# Patient Record
Sex: Female | Born: 1950 | Race: White | Hispanic: No | Marital: Married | Smoking: Never smoker
Health system: Southern US, Community
[De-identification: ages and names within clinical notes are randomized; demographics above are authoritative.]

## PROBLEM LIST (undated history)

## (undated) DIAGNOSIS — E785 Hyperlipidemia, unspecified: Secondary | ICD-10-CM

## (undated) DIAGNOSIS — J309 Allergic rhinitis, unspecified: Secondary | ICD-10-CM

## (undated) DIAGNOSIS — R030 Elevated blood-pressure reading, without diagnosis of hypertension: Secondary | ICD-10-CM

## (undated) DIAGNOSIS — S2239XA Fracture of one rib, unspecified side, initial encounter for closed fracture: Secondary | ICD-10-CM

## (undated) HISTORY — DX: Allergic rhinitis, unspecified: J30.9

## (undated) HISTORY — PX: OTHER SURGICAL HISTORY: SHX169

## (undated) HISTORY — DX: Elevated blood-pressure reading, without diagnosis of hypertension: R03.0

## (undated) HISTORY — DX: Fracture of one rib, unspecified side, initial encounter for closed fracture: S22.39XA

## (undated) HISTORY — PX: ANKLE SURGERY: SHX546

## (undated) HISTORY — DX: Hyperlipidemia, unspecified: E78.5

---

## 1999-05-21 ENCOUNTER — Other Ambulatory Visit: Admission: RE | Admit: 1999-05-21 | Discharge: 1999-05-21 | Payer: Self-pay | Admitting: Obstetrics and Gynecology

## 2000-06-17 ENCOUNTER — Other Ambulatory Visit: Admission: RE | Admit: 2000-06-17 | Discharge: 2000-06-17 | Payer: Self-pay | Admitting: Obstetrics and Gynecology

## 2001-09-13 ENCOUNTER — Other Ambulatory Visit: Admission: RE | Admit: 2001-09-13 | Discharge: 2001-09-13 | Payer: Self-pay | Admitting: Gynecology

## 2003-01-10 ENCOUNTER — Other Ambulatory Visit: Admission: RE | Admit: 2003-01-10 | Discharge: 2003-01-10 | Payer: Self-pay | Admitting: Gynecology

## 2004-01-15 ENCOUNTER — Other Ambulatory Visit: Admission: RE | Admit: 2004-01-15 | Discharge: 2004-01-15 | Payer: Self-pay | Admitting: Gynecology

## 2005-02-17 ENCOUNTER — Other Ambulatory Visit: Admission: RE | Admit: 2005-02-17 | Discharge: 2005-02-17 | Payer: Self-pay | Admitting: Gynecology

## 2005-10-22 ENCOUNTER — Ambulatory Visit: Payer: Self-pay | Admitting: Family Medicine

## 2005-12-22 ENCOUNTER — Ambulatory Visit: Payer: Self-pay | Admitting: Family Medicine

## 2005-12-22 LAB — CONVERTED CEMR LAB
Chol/HDL Ratio, serum: 2.8
Cholesterol: 246 mg/dL (ref 0–200)
HDL: 88.2 mg/dL (ref >39.0)
LDL DIRECT: 134.6 mg/dL
VLDL: 20 mg/dL (ref 0–40)

## 2006-03-16 ENCOUNTER — Other Ambulatory Visit: Admission: RE | Admit: 2006-03-16 | Discharge: 2006-03-16 | Payer: Self-pay | Admitting: Gynecology

## 2006-03-19 ENCOUNTER — Encounter: Admission: RE | Admit: 2006-03-19 | Discharge: 2006-03-19 | Payer: Self-pay | Admitting: Gynecology

## 2007-02-17 ENCOUNTER — Encounter: Admission: RE | Admit: 2007-02-17 | Discharge: 2007-02-17 | Payer: Self-pay | Admitting: Gynecology

## 2007-02-23 ENCOUNTER — Ambulatory Visit: Payer: Self-pay | Admitting: Family Medicine

## 2007-02-23 LAB — CONVERTED CEMR LAB
ALT: 29 units/L (ref 0–35)
AST: 36 units/L (ref 0–37)
Albumin: 4.6 g/dL (ref 3.5–5.2)
Alkaline Phosphatase: 93 units/L (ref 39–117)
BUN: 15 mg/dL (ref 6–23)
Basophils Absolute: 0.1 10*3/uL (ref 0.0–0.1)
Basophils Relative: 0.9 % (ref 0.0–1.0)
Bilirubin, Direct: 0.2 mg/dL (ref 0.0–0.3)
Blood in Urine, dipstick: NEGATIVE
CO2: 33 meq/L — ABNORMAL HIGH (ref 19–32)
Calcium: 10.1 mg/dL (ref 8.4–10.5)
Chloride: 96 meq/L (ref 96–112)
Cholesterol: 272 mg/dL (ref 0–200)
Creatinine, Ser: 0.9 mg/dL (ref 0.4–1.2)
Direct LDL: 174.8 mg/dL
Eosinophils Absolute: 0.3 10*3/uL (ref 0.0–0.6)
Eosinophils Relative: 4.4 % (ref 0.0–5.0)
GFR calc Af Amer: 84 mL/min
GFR calc non Af Amer: 69 mL/min
Glucose, Bld: 91 mg/dL (ref 70–99)
Glucose, Urine, Semiquant: NEGATIVE
HCT: 42.9 % (ref 36.0–46.0)
HDL: 81.1 mg/dL (ref >39.0)
Hemoglobin: 15.1 g/dL — ABNORMAL HIGH (ref 12.0–15.0)
Ketones, urine, test strip: NEGATIVE
Lymphocytes Relative: 36.2 % (ref 12.0–46.0)
MCHC: 35.1 g/dL (ref 30.0–36.0)
MCV: 96.2 fL (ref 78.0–100.0)
Monocytes Absolute: 0.6 10*3/uL (ref 0.2–0.7)
Monocytes Relative: 8.6 % (ref 3.0–11.0)
Neutro Abs: 3.7 10*3/uL (ref 1.4–7.7)
Neutrophils Relative %: 49.9 % (ref 43.0–77.0)
Nitrite: NEGATIVE
Platelets: 325 10*3/uL (ref 150–400)
Potassium: 3.5 meq/L (ref 3.5–5.1)
Protein, U semiquant: NEGATIVE
RBC: 4.46 M/uL (ref 3.87–5.11)
RDW: 13.2 % (ref 11.5–14.6)
Sodium: 140 meq/L (ref 135–145)
Specific Gravity, Urine: 1.025
TSH: 1.91 microintl units/mL (ref 0.35–5.50)
Total Bilirubin: 0.8 mg/dL (ref 0.3–1.2)
Total CHOL/HDL Ratio: 3.4
Total Protein: 7.8 g/dL (ref 6.0–8.3)
Triglycerides: 119 mg/dL (ref 0–149)
Urobilinogen, UA: 0.2
VLDL: 24 mg/dL (ref 0–40)
WBC: 7.3 10*3/uL (ref 4.5–10.5)
pH: 5.5

## 2007-03-02 ENCOUNTER — Ambulatory Visit: Payer: Self-pay | Admitting: Family Medicine

## 2007-03-02 DIAGNOSIS — R030 Elevated blood-pressure reading, without diagnosis of hypertension: Secondary | ICD-10-CM

## 2007-03-02 DIAGNOSIS — E785 Hyperlipidemia, unspecified: Secondary | ICD-10-CM | POA: Insufficient documentation

## 2007-03-02 HISTORY — DX: Elevated blood-pressure reading, without diagnosis of hypertension: R03.0

## 2007-03-02 HISTORY — DX: Hyperlipidemia, unspecified: E78.5

## 2007-03-15 ENCOUNTER — Telehealth: Payer: Self-pay | Admitting: Family Medicine

## 2007-05-06 ENCOUNTER — Ambulatory Visit: Payer: Self-pay | Admitting: Family Medicine

## 2007-05-06 LAB — CONVERTED CEMR LAB
Cholesterol: 221 mg/dL (ref 0–200)
Total CHOL/HDL Ratio: 3.5
Triglycerides: 136 mg/dL (ref 0–149)

## 2007-07-05 DIAGNOSIS — S2239XA Fracture of one rib, unspecified side, initial encounter for closed fracture: Secondary | ICD-10-CM

## 2007-07-05 HISTORY — DX: Fracture of one rib, unspecified side, initial encounter for closed fracture: S22.39XA

## 2007-07-15 ENCOUNTER — Telehealth (INDEPENDENT_AMBULATORY_CARE_PROVIDER_SITE_OTHER): Payer: Self-pay | Admitting: *Deleted

## 2007-07-18 ENCOUNTER — Telehealth: Payer: Self-pay | Admitting: *Deleted

## 2007-07-18 ENCOUNTER — Telehealth: Payer: Self-pay | Admitting: Family Medicine

## 2007-07-18 ENCOUNTER — Ambulatory Visit: Payer: Self-pay | Admitting: Family Medicine

## 2007-07-18 LAB — CONVERTED CEMR LAB
Chloride: 100 meq/L (ref 96–112)
Creatinine, Ser: 0.8 mg/dL (ref 0.4–1.2)
GFR calc Af Amer: 96 mL/min
GFR calc non Af Amer: 79 mL/min
Sodium: 140 meq/L (ref 135–145)

## 2007-07-19 ENCOUNTER — Ambulatory Visit (HOSPITAL_COMMUNITY): Admission: RE | Admit: 2007-07-19 | Discharge: 2007-07-19 | Payer: Self-pay | Admitting: Family Medicine

## 2007-07-20 ENCOUNTER — Telehealth: Payer: Self-pay | Admitting: Family Medicine

## 2008-02-21 ENCOUNTER — Encounter: Admission: RE | Admit: 2008-02-21 | Discharge: 2008-02-21 | Payer: Self-pay | Admitting: Family Medicine

## 2008-07-26 ENCOUNTER — Ambulatory Visit: Payer: Self-pay | Admitting: Family Medicine

## 2008-07-26 LAB — CONVERTED CEMR LAB
ALT: 35 units/L (ref 0–35)
AST: 39 units/L — ABNORMAL HIGH (ref 0–37)
Alkaline Phosphatase: 77 units/L (ref 39–117)
BUN: 8 mg/dL (ref 6–23)
Basophils Absolute: 0 10*3/uL (ref 0.0–0.1)
CO2: 31 meq/L (ref 19–32)
Creatinine, Ser: 0.7 mg/dL (ref 0.4–1.2)
Eosinophils Absolute: 0.2 10*3/uL (ref 0.0–0.7)
Glucose, Bld: 74 mg/dL (ref 70–99)
Glucose, Urine, Semiquant: NEGATIVE
HDL: 79.6 mg/dL (ref >39.00)
Ketones, urine, test strip: NEGATIVE
Lymphocytes Relative: 37.7 % (ref 12.0–46.0)
MCHC: 34.9 g/dL (ref 30.0–36.0)
MCV: 97.4 fL (ref 78.0–100.0)
Neutro Abs: 3 10*3/uL (ref 1.4–7.7)
Neutrophils Relative %: 50.6 % (ref 43.0–77.0)
Potassium: 3.7 meq/L (ref 3.5–5.1)
RDW: 13.1 % (ref 11.5–14.6)
Sodium: 140 meq/L (ref 135–145)
Specific Gravity, Urine: 1.02
Total Bilirubin: 0.9 mg/dL (ref 0.3–1.2)
VLDL: 19 mg/dL (ref 0.0–40.0)
pH: 5.5

## 2008-08-06 ENCOUNTER — Ambulatory Visit: Payer: Self-pay | Admitting: Family Medicine

## 2008-08-06 DIAGNOSIS — J309 Allergic rhinitis, unspecified: Secondary | ICD-10-CM | POA: Insufficient documentation

## 2008-08-06 HISTORY — DX: Allergic rhinitis, unspecified: J30.9

## 2009-04-02 ENCOUNTER — Encounter: Admission: RE | Admit: 2009-04-02 | Discharge: 2009-04-02 | Payer: Self-pay | Admitting: Family Medicine

## 2009-04-09 ENCOUNTER — Encounter: Payer: Self-pay | Admitting: Family Medicine

## 2009-04-09 ENCOUNTER — Encounter: Admission: RE | Admit: 2009-04-09 | Discharge: 2009-04-09 | Payer: Self-pay | Admitting: Family Medicine

## 2009-10-04 ENCOUNTER — Ambulatory Visit: Payer: Self-pay | Admitting: Family Medicine

## 2009-10-04 LAB — CONVERTED CEMR LAB
AST: 25 units/L (ref 0–37)
Basophils Absolute: 0.1 10*3/uL (ref 0.0–0.1)
Calcium: 9.9 mg/dL (ref 8.4–10.5)
Chloride: 97 meq/L (ref 96–112)
Cholesterol: 206 mg/dL — ABNORMAL HIGH (ref 0–200)
Creatinine, Ser: 0.9 mg/dL (ref 0.4–1.2)
Direct LDL: 117.5 mg/dL
Eosinophils Absolute: 0.4 10*3/uL (ref 0.0–0.7)
Eosinophils Relative: 4.1 % (ref 0.0–5.0)
GFR calc non Af Amer: 66.95 mL/min (ref >60)
Glucose, Urine, Semiquant: NEGATIVE
Hemoglobin: 14.2 g/dL (ref 12.0–15.0)
Lymphocytes Relative: 28.9 % (ref 12.0–46.0)
Lymphs Abs: 2.6 10*3/uL (ref 0.7–4.0)
MCHC: 34.2 g/dL (ref 30.0–36.0)
Monocytes Absolute: 0.7 10*3/uL (ref 0.1–1.0)
Monocytes Relative: 7.4 % (ref 3.0–12.0)
Neutro Abs: 5.3 10*3/uL (ref 1.4–7.7)
Neutrophils Relative %: 58.4 % (ref 43.0–77.0)
Nitrite: NEGATIVE
Potassium: 4.1 meq/L (ref 3.5–5.1)
Protein, U semiquant: NEGATIVE
RBC: 4.23 M/uL (ref 3.87–5.11)
Sodium: 141 meq/L (ref 135–145)
TSH: 1.5 microintl units/mL (ref 0.35–5.50)
Triglycerides: 119 mg/dL (ref 0.0–149.0)
WBC Urine, dipstick: NEGATIVE
WBC: 9.1 10*3/uL (ref 4.5–10.5)

## 2009-10-11 ENCOUNTER — Ambulatory Visit: Payer: Self-pay | Admitting: Family Medicine

## 2010-03-23 ENCOUNTER — Encounter: Payer: Self-pay | Admitting: Family Medicine

## 2010-04-03 NOTE — Assessment & Plan Note (Signed)
Summary: CPX/CJR   Vital Signs:  Patient profile:   60 year old female Menstrual status:  postmenopausal Height:      65 inches Weight:      145 pounds BMI:     24.22 Temp:     98.5 degrees F oral BP sitting:   130 / 84  (left arm) Cuff size:   regular  Vitals Entered By: Kathrynn Speed CMA (October 11, 2009 2:00 PM) CC: cpx, no pap, with lab review, src   CC:  cpx, no pap, with lab review, and src.  History of Present Illness: Danielle Harvey is a 60 year old female, who comes in today for general physical examination because of underlying hypertension, hyperlipidemia, and allergic rhinitis.  For hypertension.  She takes Maxzide 75 -- 50 daily BP 150/90  For her allergies she takes either Allegra 180 daily 4 Zyrtec 10 nightly  She is on HRT.  D. her GYN estrogen patches one half patch weekly.  She takes Lipitor 40 mg nightly for hyperlipidemia.  Lipids are going with an LDL of 117.  She said some spells of the past 4 weeks.  That occur about once or twice per week and last for a few seconds.  The spell.  She describes as blurred vision and is usually associate with watching TV or looking in the computer screen.  Neurologic review of systems otherwise negative.  Recommended Dr. Vonna Kotyk for exam.  Today she had some cramping of her left hand and when the spasm resolved spontaneously.  Mammogram 2011 January, tetanus, 2007, seasonal flu 2010, colonoscopy, normal,pap  By GYN,  she plays golf at the [employer] and works full-time  Current Medications (verified): 1)  Triamterene-Hctz 75-50 Mg  Tabs (Triamterene-Hctz) .Marland Kitchen.. 1 Tab Every Am Cpx When Due 2)  Lipitor 40 Mg  Tabs (Atorvastatin Calcium) .... One Tab By Mouth Daily 3)  Adult Aspirin Low Strength 81 Mg  Tbdp (Aspirin) 4)  Zyrtec Allergy 10 Mg  Tabs (Cetirizine Hcl) .... Otc 5)  Vivelle-Dot 0.05 Mg/24hr  Pttw (Estradiol) .... 2 X Week 6)  Calcium 500/d 500-400 Mg-Unit  Chew (Calcium-Vitamin D) 7)  Mi-Basic T   Tabs (Multiple  Vitamins-Minerals) 8)  Fish Oil   Oil (Fish Oil) 9)  Allegra 180 Mg Tabs (Fexofenadine Hcl) .... Take 1 Tablet By Mouth Every Morning 10)  Flonase 50 Mcg/act Susp (Fluticasone Propionate) .... Uad  Allergies (verified): No Known Drug Allergies  Past History:  Past medical, surgical, family and social histories (including risk factors) reviewed, and no changes noted (except as noted below).  Past Medical History: Reviewed history from 03/02/2007 and no changes required. Hyperlipidemia  Family History: Reviewed history from 03/02/2007 and no changes required. Family History Breast cancer 1st degree relative <50 Family History High cholesterol Family History Hypertension  Social History: Reviewed history from 03/02/2007 and no changes required. Occupation: Married Never Smoked Alcohol use-yes Drug use-no Regular exercise-yes  Review of Systems      See HPI  Physical Exam  General:  Well-developed,well-nourished,in no acute distress; alert,appropriate and cooperative throughout examination Head:  Normocephalic and atraumatic without obvious abnormalities. No apparent alopecia or balding. Eyes:  No corneal or conjunctival inflammation noted. EOMI. Perrla. Funduscopic exam benign, without hemorrhages, exudates or papilledema. Vision grossly normal. Ears:  External ear exam shows no significant lesions or deformities.  Otoscopic examination reveals clear canals, tympanic membranes are intact bilaterally without bulging, retraction, inflammation or discharge. Hearing is grossly normal bilaterally. Nose:  External nasal examination shows no deformity or  inflammation. Nasal mucosa are pink and moist without lesions or exudates. Mouth:  Oral mucosa and oropharynx without lesions or exudates.  Teeth in good repair. Neck:  No deformities, masses, or tenderness noted. Chest Wall:  No deformities, masses, or tenderness noted. Breasts:  No mass, nodules, thickening, tenderness, bulging,  retraction, inflamation, nipple discharge or skin changes noted.   Lungs:  Normal respiratory effort, chest expands symmetrically. Lungs are clear to auscultation, no crackles or wheezes. Heart:  Normal rate and regular rhythm. S1 and S2 normal without gallop, murmur, click, rub or other extra sounds. Abdomen:  Bowel sounds positive,abdomen soft and non-tender without masses, organomegaly or hernias noted. Msk:  No deformity or scoliosis noted of thoracic or lumbar spine.   Pulses:  R and L carotid,radial,femoral,dorsalis pedis and posterior tibial pulses are full and equal bilaterally Extremities:  No clubbing, cyanosis, edema, or deformity noted with normal full range of motion of all joints.   Neurologic:  No cranial nerve deficits noted. Station and gait are normal. Plantar reflexes are down-going bilaterally. DTRs are symmetrical throughout. Sensory, motor and coordinative functions appear intact. Skin:  Intact without suspicious lesions or rashes Cervical Nodes:  No lymphadenopathy noted Axillary Nodes:  No palpable lymphadenopathy Inguinal Nodes:  No significant adenopathy Psych:  Cognition and judgment appear intact. Alert and cooperative with normal attention span and concentration. No apparent delusions, illusions, hallucinations   Impression & Recommendations:  Problem # 1:  ALLERGIC RHINITIS (ICD-477.9) Assessment Improved  The following medications were removed from the medication list:    Allegra 180 Mg Tabs (Fexofenadine hcl) .Marland Kitchen... Take 1 tablet by mouth every morning    Flonase 50 Mcg/act Susp (Fluticasone propionate) ..... Uad Her updated medication list for this problem includes:    Zyrtec Allergy 10 Mg Tabs (Cetirizine hcl) ..... Otc  Orders: Prescription Created Electronically [phone])  Problem # 2:  ELEVATED BLOOD PRESSURE WITHOUT DIAGNOSIS OF HYPERTENSION (ICD-796.2) Assessment: Improved  Her updated medication list for this problem includes:    Triamterene-hctz  75-50 Mg Tabs (Triamterene-hctz) .Marland Kitchen... 1 tab every am cpx when due  Orders: Prescription Created Electronically [phone])  Problem # 3:  HYPERLIPIDEMIA (ICD-272.4) Assessment: Improved  Her updated medication list for this problem includes:    Lipitor 40 Mg Tabs (Atorvastatin calcium) ..... One tab by mouth daily  Orders: Prescription Created Electronically [phone]) EKG w/ Interpretation (93000)  Problem # 4:  HEALTH SCREENING (ICD-V70.0) Assessment: Unchanged  Orders: Prescription Created Electronically [phone]) EKG w/ Interpretation (93000)  Complete Medication List: 1)  Triamterene-hctz 75-50 Mg Tabs (Triamterene-hctz) .Marland Kitchen.. 1 tab every am cpx when due 2)  Lipitor 40 Mg Tabs (Atorvastatin calcium) .... One tab by mouth daily 3)  Adult Aspirin Low Strength 81 Mg Tbdp (Aspirin) 4)  Zyrtec Allergy 10 Mg Tabs (Cetirizine hcl) .... Otc 5)  Vivelle-dot 0.05 Mg/24hr Pttw (Estradiol) .... 2 x week 6)  Calcium 500/d 500-400 Mg-unit Chew (Calcium-vitamin d) 7)  Mi-basic T Tabs (Multiple vitamins-minerals) 8)  Fish Oil Oil (Fish oil)  Patient Instructions: 1)  Schedule your mammogram./BSE monthly!!!!!!!!!!!!!!!!!!!!!!!!!!!!!!!!!!!! 2)  Schedule a colonoscopy/sigmoidoscopy to help detect colon cancer. 3)  Take calcium +Vitamin D daily. 4)  Take an Aspirin every day. 5)  SPF 50 sunscreen  Prescriptions: LIPITOR 40 MG  TABS (ATORVASTATIN CALCIUM) one tab by mouth daily  #100 x 3   Entered and Authorized by:   Roderick Pee MD   Signed by:   Roderick Pee MD on 10/11/2009   Method used:  Electronically to        [facility] [phone]* (retail)       [address]       [place], [place]  [address]       Ph: [phone] or [phone]       Fax: [phone]   RxID:   [id number] TRIAMTERENE-HCTZ 75-50 MG  TABS (TRIAMTERENE-HCTZ) 1 TAB EVERY AM CPX WHEN DUE  #100 Tablet x 3   Entered and Authorized by:   Roderick Pee MD   Signed by:   Roderick Pee MD on 10/11/2009   Method  used:   Electronically to        [facility] [phone]* (retail)       [address]       [place], [place]  [address]       Ph: [phone] or [phone]       Fax: [phone]   RxID:   [id number]

## 2010-04-10 ENCOUNTER — Other Ambulatory Visit: Payer: Self-pay | Admitting: Family Medicine

## 2010-04-10 DIAGNOSIS — Z1231 Encounter for screening mammogram for malignant neoplasm of breast: Secondary | ICD-10-CM

## 2010-04-16 ENCOUNTER — Ambulatory Visit
Admission: RE | Admit: 2010-04-16 | Discharge: 2010-04-16 | Disposition: A | Discharge status: Home / Self Care | Payer: PRIVATE HEALTH INSURANCE | Source: Ambulatory Visit | Attending: Family Medicine | Admitting: Family Medicine

## 2010-04-16 DIAGNOSIS — Z1231 Encounter for screening mammogram for malignant neoplasm of breast: Secondary | ICD-10-CM

## 2010-05-05 ENCOUNTER — Other Ambulatory Visit: Payer: Self-pay | Admitting: Family Medicine

## 2010-05-05 DIAGNOSIS — I10 Essential (primary) hypertension: Secondary | ICD-10-CM

## 2010-07-18 NOTE — Assessment & Plan Note (Signed)
[facility]                              [facility] OFFICE NOTE   CERINA, LEARY                         MRN:          [id number]  DATE:10/22/2005                            DOB:          06/18/1950    This is the first [facility] visit for this delightful lady, born  [birth year] (that makes her 60 years old), who comes in today to see [place]  for a new patient evaluation because of multiple issues and concerns.   PAST MEDICAL HISTORY/PAST HOSPITALIZATIONS:  None.   PAST OUTPATIENT SURGERY:  None.   ILLNESSES:  None.   INJURIES:  None.   DRUG ALLERGIES:  CODEINE causes nausea and vomiting, not hives.  She does  not smoke.  She drinks 2-3 vodka drinks per evening, denies any illicit drug  use.   CURRENT MEDICATIONS:  1. Zyrtec 10 mg daily for allergic rhinitis.  2. Maxzide 37.5/25 mg for fluid retention and it is not working.  3. Lipitor 10 mg nightly; it is not working.  Lipids are not at goal.  4. Vivelle-Dot 0.5 mcg daily.  5. One baby aspirin a day.   REVIEW OF SYSTEMS:  HEENT:  She was told she had early cataracts by Dr.  Dione Booze.  She is asymptomatic.  She gets regular dental care.  CARDIOPULMONARY:  Negative.  GI:  Negative.  She has also had a colonoscopy  in 2007; it was normal.  GU:  Negative.  GYN:  Gravida 0, para 0.  She is in  the menopausal state.  She is on HRT as prescribe by Pamelia Hoit, nurse  practitioner.  Last Pap was by Darl Pikes; she is going back to see her for that.  She has not been seen on a regular basis.  Last mammogram was December of  2006.  ENDOCRINE:  Negative.  Weight steady.  MUSCULOSKELETAL:  Negative  VASCULAR:  Negative.  ALLERGIES:  Pertinent.  She has allergic rhinitis  controlled with Zyrtec.   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  She lives at the [place].  She and her husband play a lot of  golf, married, no children.   She is  originally from [place], [place].  Her husband is from  [place].   FAMILY HISTORY:  Significant.  She was adopted at age 23.  She does not know  anything about her biologic father.  Her stepfather is 11 and in good  health.  Her biologic mother is 75, has hypertension, hyperlipidemia,  diabetes and venous insufficiency.  She is NOC.Marland Kitchen   VACCINATION HISTORY:  She is not really sure when her last tetanus was.   She brings in laboratory data from Pamelia Hoit, nurse practitioner, that  shows her lipids are not at goal.  She says Darl Pikes had recommended she switch  from 10 mg of Lipitor to Vytorin; however, she has not tried to increase the  Lipitor first.  I explained to her that my protocol was to increase the  Lipitor up to 80 mg  a day.  If at that juncture, we could not control her  lipids, then we can consider switching to another product.  If indeed the  fluid retention is not controlled by the current dose of Maxzide, we would  need to be stringent about fluid/salt retention, which she has, and would  take a low-dose diuretic on a regular basis, but increase the Maxzide to  75/50 from 37.5/25.   PHYSICAL EVALUATION:  VITAL SIGNS:  Height 5 feet 6 inches, weight 156.  BP  160/80.  She says she gets nervous when she goes to the doctor.  I  recommended b.i.d. digital blood pressure cuff monitoring at home for 3  weeks.  If blood pressure drops to goal, then fine; if not, she is to bring  all the data and the device and come back and see [place].  Pulse is 80 and  regular.  She is afebrile.  GENERAL:  She is a well-developed, well-nourished white female in no acute  distress.  HEENT:  Negative.  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  NECK:  Supple.  The thyroid is not enlarged.  No carotid bruits.  CHEST:  Clear to auscultation.  CARDIAC:  Negative.  BREASTS:  Exam was negative, except for a scratch on her right breast from  her cat.  She was advised if it gets swollen or rather hot to  call  immediately.  ABDOMEN:  Exam was negative.  PELVIC AND RECTAL:  Done by GYN nurse, therefore were referred.  EXTREMITIES:  Normal.  SKIN:  Normal.  PERIPHERAL PULSES:  Normal.   LABORATORY DATA:  Laboratory data brought in by her from [facility]  office were normal, except for lipids, as noted above.   IMPRESSION:  Hyperlipidemia.   PLAN:  1. Increase Lipitor to 20 mg nightly.  Followup lipid panel in 2 months.  2. Allergic rhinitis:  Continue Zyrtec.  3. Fluid retention.  4. Increase Maxzide to 70/50 mg one daily.  5. Postmenopausal.  Defer to Pamelia Hoit.  6. The patient is to continue to take a daily baby aspirin.  She is to      monitor her blood pressure at home and return if her blood pressure      does not drop back to normal.  She was advised      to continue calcium and vitamin D to help maintain bone strength.  She      also has a copy of her bone density, which was done this year, and it      was normal.                                   Tinnie Gens A. Tawanna Cooler, MD   JAT/MedQ  DD:  10/22/2005  DT:  10/23/2005  Job #:  [id number]

## 2010-10-14 ENCOUNTER — Other Ambulatory Visit (INDEPENDENT_AMBULATORY_CARE_PROVIDER_SITE_OTHER): Payer: PRIVATE HEALTH INSURANCE

## 2010-10-14 DIAGNOSIS — Z0000 Encounter for general adult medical examination without abnormal findings: Secondary | ICD-10-CM

## 2010-10-14 LAB — BASIC METABOLIC PANEL
BUN: 14 mg/dL (ref 6–23)
Chloride: 93 mEq/L — ABNORMAL LOW (ref 96–112)
Creatinine, Ser: 0.6 mg/dL (ref 0.4–1.2)
GFR: 107.19 mL/min (ref >60.00)
Glucose, Bld: 87 mg/dL (ref 70–99)
Potassium: 3.3 mEq/L — ABNORMAL LOW (ref 3.5–5.1)
Sodium: 135 mEq/L (ref 135–145)

## 2010-10-14 LAB — CBC WITH DIFFERENTIAL/PLATELET
Basophils Relative: 0.8 % (ref 0.0–3.0)
Eosinophils Absolute: 0.4 10*3/uL (ref 0.0–0.7)
Eosinophils Relative: 4.6 % (ref 0.0–5.0)
Hemoglobin: 13.8 g/dL (ref 12.0–15.0)
Monocytes Relative: 10.5 % (ref 3.0–12.0)
Neutro Abs: 4.1 10*3/uL (ref 1.4–7.7)
Platelets: 286 10*3/uL (ref 150.0–400.0)
RDW: 14 % (ref 11.5–14.6)
WBC: 8.1 10*3/uL (ref 4.5–10.5)

## 2010-10-14 LAB — HEPATIC FUNCTION PANEL
ALT: 29 U/L (ref 0–35)
Bilirubin, Direct: 0 mg/dL (ref 0.0–0.3)
Total Bilirubin: 0.7 mg/dL (ref 0.3–1.2)
Total Protein: 7.8 g/dL (ref 6.0–8.3)

## 2010-10-14 LAB — POCT URINALYSIS DIPSTICK
Bilirubin, UA: NEGATIVE
Blood, UA: NEGATIVE
Glucose, UA: NEGATIVE
Protein, UA: NEGATIVE

## 2010-10-14 LAB — LIPID PANEL: HDL: 99.6 mg/dL (ref >39.00)

## 2010-10-20 ENCOUNTER — Encounter: Payer: Self-pay | Admitting: Family Medicine

## 2010-10-21 ENCOUNTER — Ambulatory Visit (INDEPENDENT_AMBULATORY_CARE_PROVIDER_SITE_OTHER): Payer: PRIVATE HEALTH INSURANCE | Admitting: Family Medicine

## 2010-10-21 ENCOUNTER — Encounter: Payer: Self-pay | Admitting: Family Medicine

## 2010-10-21 DIAGNOSIS — Z0000 Encounter for general adult medical examination without abnormal findings: Secondary | ICD-10-CM

## 2010-10-21 DIAGNOSIS — I10 Essential (primary) hypertension: Secondary | ICD-10-CM

## 2010-10-21 DIAGNOSIS — E785 Hyperlipidemia, unspecified: Secondary | ICD-10-CM

## 2010-10-21 DIAGNOSIS — R030 Elevated blood-pressure reading, without diagnosis of hypertension: Secondary | ICD-10-CM

## 2010-10-21 DIAGNOSIS — J309 Allergic rhinitis, unspecified: Secondary | ICD-10-CM

## 2010-10-21 MED ORDER — ATORVASTATIN CALCIUM 40 MG PO TABS: 40.0000 mg | ORAL_TABLET | Freq: Every day | ORAL | Status: DC

## 2010-10-21 MED ORDER — TRIAMTERENE-HCTZ 75-50 MG PO TABS: 1.0000 | ORAL_TABLET | Freq: Every day | ORAL | Status: DC

## 2010-10-21 NOTE — Patient Instructions (Signed)
Continue current medications  Return sometime in the next couple weeks for removal of the lesion on the posterior portion of your left leg.  Consider trying to get off of the hormones.  This winter  Return in one year for a complete exam,,,,,,, sooner if any problems

## 2010-10-21 NOTE — Progress Notes (Signed)
  Subjective:    Patient ID: Danielle Harvey, female    DOB: 1950/04/11, 60 y.o.   MRN: [id number]  HPI Danielle Harvey is a 60 year old, married female, nonsmoker, who comes in today for evaluation of allergic rhinitis, hyperlipidemia, elevated blood pressure,  She takes Lipitor 40 mg daily, and a baby aspirin.  Lipids are normal with an LDL of 124, HDL 99.  She continues to use the hormonal patches, although she cut them in half and uses a half a patch once weekly instead of twice weekly.  Less when her she tried stopping and was miserable with hot flashes and insomnia.  Explain the negatives of continued hormone replacement therapy.  Should check with her GYN.  She takes OTC Zyrtec for allergic rhinitis, calcium and vitamin D.  She also takes Maxzide 75 -- 58 daily for mild elevation of blood pressure.  BP 140/90.  She had a cataract removed from her right eye.  Last year by Dr. Vonna Kotyk, left eye pending.  She gets routine eye care, dental care, does not do BSE monthly, gets annual mammogram, colonoscopy 7 years.  Normal.  Tetanus 2007   Review of Systems  Constitutional: Negative.   HENT: Negative.   Eyes: Negative.   Respiratory: Negative.   Cardiovascular: Negative.   Gastrointestinal: Negative.   Genitourinary: Negative.   Musculoskeletal: Negative.   Neurological: Negative.   Hematological: Negative.   Psychiatric/Behavioral: Negative.        Objective:   Physical Exam  Constitutional: She appears well-developed and well-nourished.  HENT:  Head: Normocephalic and atraumatic.  Right Ear: External ear normal.  Left Ear: External ear normal.  Nose: Nose normal.  Mouth/Throat: Oropharynx is clear and moist.  Eyes: EOM are normal. Pupils are equal, round, and reactive to light.  Neck: Normal range of motion. Neck supple. No thyromegaly present.  Cardiovascular: Normal rate, regular rhythm, normal heart sounds and intact distal pulses.  Exam reveals no gallop and no friction rub.     No murmur heard. Pulmonary/Chest: Effort normal and breath sounds normal.  Abdominal: Soft. Bowel sounds are normal. She exhibits no distension and no mass. There is no tenderness. There is no rebound.  Genitourinary:       Bilateral breast exam normal  Musculoskeletal: Normal range of motion.  Lymphadenopathy:    She has no cervical adenopathy.  Neurological: She is alert. She has normal reflexes. No cranial nerve deficit. She exhibits normal muscle tone. Coordination normal.  Skin: Skin is warm and dry.  Psychiatric: She has a normal mood and affect. Her behavior is normal. Judgment and thought content normal.   Total body skin exam normal except for black lesion, posterior upper left thigh.  Advised to return for removal       Assessment & Plan:  Healthy female.  Hyperlipidemia.  Continue Lipitor 40 and 81 mg baby aspirin daily.  Allergic rhinitis.  Continue Zyrtec 10 mg nightly  Hypertension.  Continue Maxzide 70 -- 58 daily.  Postmenopausal recommend she stopped HRT.  Check with GYN.  Status post cataract removal, right eye.  Return sometime in the next couple weeks for removal of the black lesion on the posterior portion of the left leg

## 2010-11-10 ENCOUNTER — Encounter: Payer: Self-pay | Admitting: Family Medicine

## 2010-11-10 ENCOUNTER — Ambulatory Visit (INDEPENDENT_AMBULATORY_CARE_PROVIDER_SITE_OTHER): Payer: PRIVATE HEALTH INSURANCE | Admitting: Family Medicine

## 2010-11-10 DIAGNOSIS — D239 Other benign neoplasm of skin, unspecified: Secondary | ICD-10-CM

## 2010-11-10 DIAGNOSIS — D229 Melanocytic nevi, unspecified: Secondary | ICD-10-CM

## 2010-11-10 DIAGNOSIS — Z23 Encounter for immunization: Secondary | ICD-10-CM

## 2010-11-10 DIAGNOSIS — D2360 Other benign neoplasm of skin of unspecified upper limb, including shoulder: Secondary | ICD-10-CM

## 2010-11-10 NOTE — Progress Notes (Signed)
  Subjective:    Patient ID: Danielle Harvey, female    DOB: 16-Nov-1950, 60 y.o.   MRN: [id number]  HPISheila is a 60 year old female, married, nonsmoker comes in today for removal of a black lesion on her posterior left upper thigh.  The lesion is 5-mm times 5-mm black lesion...........Marland KitchenPosterior left upper thigh  After informed consent, the lesion was anesthetized with 1% Xylocaine with epinephrine.  It was removed with 3-mm margins.  The base was cauterized.  A dry sterile bandage was applied.  She tolerated the procedure.  No complications.  Lesion sent for pathologic analysis.  Clinically it looks like a dysplastic nevus    Review of Systems General and dermatologic review of systems otherwise negative    Objective:   Physical Exam   Procedure see above     Assessment & Plan:  Lesion removed, probable dysplastic nevus, awaiting path

## 2011-01-20 ENCOUNTER — Other Ambulatory Visit: Payer: Self-pay | Admitting: Gynecology

## 2011-04-01 ENCOUNTER — Other Ambulatory Visit: Payer: Self-pay | Admitting: Family Medicine

## 2011-04-01 DIAGNOSIS — Z1231 Encounter for screening mammogram for malignant neoplasm of breast: Secondary | ICD-10-CM

## 2011-04-13 ENCOUNTER — Ambulatory Visit (INDEPENDENT_AMBULATORY_CARE_PROVIDER_SITE_OTHER): Payer: Commercial Managed Care - PPO | Admitting: Family Medicine

## 2011-04-13 ENCOUNTER — Telehealth: Payer: Self-pay | Admitting: *Deleted

## 2011-04-13 ENCOUNTER — Encounter: Payer: Self-pay | Admitting: Family Medicine

## 2011-04-13 DIAGNOSIS — J45909 Unspecified asthma, uncomplicated: Secondary | ICD-10-CM | POA: Insufficient documentation

## 2011-04-13 DIAGNOSIS — J069 Acute upper respiratory infection, unspecified: Secondary | ICD-10-CM

## 2011-04-13 MED ORDER — HYDROCODONE-HOMATROPINE 5-1.5 MG/5ML PO SYRP: ORAL_SOLUTION | ORAL | Status: DC

## 2011-04-13 MED ORDER — PREDNISONE 20 MG PO TABS: ORAL_TABLET | ORAL | Status: DC

## 2011-04-13 NOTE — Telephone Encounter (Signed)
Pt calls for appt with sore throat and cough.  Scheduled with Dr Tawanna Cooler today.

## 2011-04-13 NOTE — Patient Instructions (Signed)
Drink lots of water  Run a vaporizer in her bedroom at night  Hydromet 1/2-1 teaspoon at bedtime when necessary for nighttime cough  Prednisone as directed  Return when necessary

## 2011-04-13 NOTE — Progress Notes (Signed)
  Subjective:    Patient ID: Danielle Harvey, female    DOB: Jul 09, 1950, 61 y.o.   MRN: [id number]  HPI Danielle Harvey is a 61 year old married female nonsmoker who comes in today with a five-day history of a cough  She was probably around some sick children last week and developed a cold. 3 nights ago she began coughing so much she couldn't sleep at night. She has had a history of allergic rhinitis. No fever no sputum production.   Review of Systems General and pulmonary review of systems otherwise negative    Objective:   Physical Exam  Well-developed well-nourished female in acute distress HEENT negative neck was supple no adenopathy lungs are clear except for mild late expiratory wheezing      Assessment & Plan:  Viral syndrome with secondary asthma plan prednisone burst and taper return when necessary

## 2011-04-16 ENCOUNTER — Encounter: Payer: Self-pay | Admitting: Family Medicine

## 2011-04-16 ENCOUNTER — Ambulatory Visit (INDEPENDENT_AMBULATORY_CARE_PROVIDER_SITE_OTHER): Payer: Commercial Managed Care - PPO | Admitting: Family Medicine

## 2011-04-16 DIAGNOSIS — J45909 Unspecified asthma, uncomplicated: Secondary | ICD-10-CM

## 2011-04-16 NOTE — Patient Instructions (Signed)
Take 60 mg of prednisone,,,,,, 3 tabs,,,,,,,, daily for 3 days, 2 tabs for 3 days, 1 tab for 3 days, a half a tablet for 3 days, then half a tablet Monday Wednesday Friday for a two-week taper  Return when necessary,

## 2011-04-16 NOTE — Progress Notes (Signed)
  Subjective:    Patient ID: Danielle Harvey, female    DOB: 1950/04/27, 61 y.o.   MRN: [id number]  HPI  Danielle Harvey is a 61 year old married female nonsmoker who comes in today for reevaluation of asthma  She had a viral syndrome last week to triggered her asthma. We started on 40 mg a day of prednisone on Monday and she comes in today stating she does not feel a whole better. No side effects from medication    Review of Systems    general and metabolic review of systems and pulmonary review of systems otherwise negative Objective:   Physical Exam Well-developed well-nourished female in no acute distress respiratory rate 8 and unlabored  HEENT negative neck was supple no adenopathy lungs shows symmetrical normal breath sounds with late expiratory mild wheezing       Assessment & Plan:  Asthma increase prednisone to 60 mg a day taper slowly return when necessary

## 2011-04-21 ENCOUNTER — Ambulatory Visit
Admission: RE | Admit: 2011-04-21 | Discharge: 2011-04-21 | Disposition: A | Discharge status: Home / Self Care | Payer: Commercial Managed Care - PPO | Source: Ambulatory Visit | Attending: Family Medicine | Admitting: Family Medicine

## 2011-04-21 DIAGNOSIS — Z1231 Encounter for screening mammogram for malignant neoplasm of breast: Secondary | ICD-10-CM

## 2011-07-23 ENCOUNTER — Telehealth: Payer: Self-pay

## 2011-11-09 ENCOUNTER — Other Ambulatory Visit: Payer: Self-pay | Admitting: Family Medicine

## 2012-01-14 ENCOUNTER — Other Ambulatory Visit (INDEPENDENT_AMBULATORY_CARE_PROVIDER_SITE_OTHER): Payer: Commercial Managed Care - PPO

## 2012-01-14 DIAGNOSIS — Z0000 Encounter for general adult medical examination without abnormal findings: Secondary | ICD-10-CM

## 2012-01-14 LAB — POCT URINALYSIS DIPSTICK
Nitrite, UA: NEGATIVE
Protein, UA: NEGATIVE
Urobilinogen, UA: 0.2

## 2012-01-14 LAB — HEPATIC FUNCTION PANEL
AST: 26 U/L (ref 0–37)
Alkaline Phosphatase: 79 U/L (ref 39–117)
Total Bilirubin: 0.8 mg/dL (ref 0.3–1.2)

## 2012-01-14 LAB — CBC WITH DIFFERENTIAL/PLATELET
Basophils Absolute: 0.1 10*3/uL (ref 0.0–0.1)
Hemoglobin: 13.4 g/dL (ref 12.0–15.0)
Lymphocytes Relative: 37.6 % (ref 12.0–46.0)
Monocytes Relative: 11.3 % (ref 3.0–12.0)
Neutrophils Relative %: 45.9 % (ref 43.0–77.0)
Platelets: 326 10*3/uL (ref 150.0–400.0)
RDW: 13.3 % (ref 11.5–14.6)

## 2012-01-14 LAB — BASIC METABOLIC PANEL
BUN: 16 mg/dL (ref 6–23)
Calcium: 10.1 mg/dL (ref 8.4–10.5)
GFR: 79.19 mL/min (ref >60.00)
Glucose, Bld: 97 mg/dL (ref 70–99)
Sodium: 137 mEq/L (ref 135–145)

## 2012-01-14 LAB — LIPID PANEL
Cholesterol: 232 mg/dL — ABNORMAL HIGH (ref 0–200)
Total CHOL/HDL Ratio: 3
Triglycerides: 91 mg/dL (ref 0.0–149.0)

## 2012-01-14 LAB — TSH: TSH: 0.34 u[IU]/mL — ABNORMAL LOW (ref 0.35–5.50)

## 2012-01-18 ENCOUNTER — Other Ambulatory Visit: Payer: Self-pay | Admitting: Family Medicine

## 2012-01-21 ENCOUNTER — Encounter: Payer: Self-pay | Admitting: Family Medicine

## 2012-01-21 ENCOUNTER — Ambulatory Visit (INDEPENDENT_AMBULATORY_CARE_PROVIDER_SITE_OTHER): Payer: Commercial Managed Care - PPO | Admitting: Family Medicine

## 2012-01-21 VITALS — BP 148/90 | Temp 98.2°F | Ht 64.75 in | Wt 142.0 lb

## 2012-01-21 DIAGNOSIS — J309 Allergic rhinitis, unspecified: Secondary | ICD-10-CM

## 2012-01-21 DIAGNOSIS — E785 Hyperlipidemia, unspecified: Secondary | ICD-10-CM

## 2012-01-21 DIAGNOSIS — R030 Elevated blood-pressure reading, without diagnosis of hypertension: Secondary | ICD-10-CM

## 2012-01-21 MED ORDER — ATORVASTATIN CALCIUM 40 MG PO TABS: 40.0000 mg | ORAL_TABLET | Freq: Every day | ORAL | Status: AC

## 2012-01-21 MED ORDER — TRIAMTERENE-HCTZ 75-50 MG PO TABS: 1.0000 | ORAL_TABLET | Freq: Every day | ORAL | Status: DC

## 2012-01-21 NOTE — Patient Instructions (Signed)
Check your blood pressure daily in the morning for 2 weeks to be sure your blood pressure is at goal,,,,,,,,,,,, 135/85 or less,,,,,,,,,,,, if it is at goal then I would just check it weekly. If not return with the data and the device for followup  Continue your Lipitor and aspirin daily  Again I would discuss with you GYN be continued use of estrogen

## 2012-01-21 NOTE — Progress Notes (Signed)
  Subjective:    Patient ID: Danielle Harvey, female    DOB: 04/20/1950, 61 y.o.   MRN: [id number]  HPI Danielle Harvey is a 61 year old married female nonsmoker who comes in today for general physical examination because of a history of hyperlipidemia on Lipitor 40 mg daily along with an aspirin tablet, allergic rhinitis on Zyrtec 10 mg each bedtime, mild hypertension on Maxzide 75/50 daily. She also takes a hormone patch from her GYN. She's been on this now for about 6 years. Advised to try to stop which he did last winter for 3 months but restarted because she felt miserable with the hot flashes.  She gets routine eye care,,,,,,,,, recently had a left cataract removed right cataract removed in 2011 by Dr. Vonna Kotyk,,,,,,,,, regular dental care, BSE every other month, and you mammography, colonoscopy 9 years ago normal, tetanus 2007, seasonal flu shot 2013.  We again reiterated the long-term risks of unopposed estrogen. She still has her uterus and ovaries intact    Review of Systems  Constitutional: Negative.   HENT: Negative.   Eyes: Negative.   Respiratory: Negative.   Cardiovascular: Negative.   Gastrointestinal: Negative.   Genitourinary: Negative.   Musculoskeletal: Negative.   Neurological: Negative.   Hematological: Negative.   Psychiatric/Behavioral: Negative.        Objective:   Physical Exam  Constitutional: She appears well-developed and well-nourished.  HENT:  Head: Normocephalic and atraumatic.  Right Ear: External ear normal.  Left Ear: External ear normal.  Nose: Nose normal.  Mouth/Throat: Oropharynx is clear and moist.  Eyes: EOM are normal. Pupils are equal, round, and reactive to light.  Neck: Normal range of motion. Neck supple. No thyromegaly present.  Cardiovascular: Normal rate, regular rhythm, normal heart sounds and intact distal pulses.  Exam reveals no gallop and no friction rub.   No murmur heard. Pulmonary/Chest: Effort normal and breath sounds normal.    Abdominal: Soft. Bowel sounds are normal. She exhibits no distension and no mass. There is no tenderness. There is no rebound.  Genitourinary:       Bilateral breast exam normal  Musculoskeletal: Normal range of motion.  Lymphadenopathy:    She has no cervical adenopathy.  Neurological: She is alert. She has normal reflexes. No cranial nerve deficit. She exhibits normal muscle tone. Coordination normal.  Skin: Skin is warm and dry.  Psychiatric: She has a normal mood and affect. Her behavior is normal. Judgment and thought content normal.          Assessment & Plan:  Healthy female  Hyperlipidemia continue Lipitor 40 mg and an aspirin tablet  Allergic rhinitis continue Zyrtec 10 mg each bedtime  Mild hypertension continue Maxzide check BP at home to be should set goal  Post menopausal hot flashes again discussed with him GYN ongoing unopposed estrogen

## 2012-02-01 ENCOUNTER — Other Ambulatory Visit: Payer: Self-pay | Admitting: Gynecology

## 2012-06-07 ENCOUNTER — Other Ambulatory Visit: Payer: Self-pay

## 2012-06-07 DIAGNOSIS — Z1231 Encounter for screening mammogram for malignant neoplasm of breast: Secondary | ICD-10-CM

## 2012-06-30 ENCOUNTER — Ambulatory Visit
Admission: RE | Admit: 2012-06-30 | Discharge: 2012-06-30 | Disposition: A | Discharge status: Home / Self Care | Payer: Commercial Managed Care - PPO | Source: Ambulatory Visit

## 2012-06-30 DIAGNOSIS — Z1231 Encounter for screening mammogram for malignant neoplasm of breast: Secondary | ICD-10-CM

## 2012-07-14 ENCOUNTER — Other Ambulatory Visit: Payer: Self-pay | Admitting: Family Medicine

## 2012-08-05 ENCOUNTER — Telehealth: Payer: Self-pay | Admitting: Family Medicine

## 2012-08-05 NOTE — Telephone Encounter (Signed)
Danielle Harvey please call her,,,,,,, if she's not she needs to see an ophthalmologist,,,,,, Dr. Fawn Kirk

## 2012-08-05 NOTE — Telephone Encounter (Signed)
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 Pt's eye doctor found this and advised pt to inform PCP right away.

## 2012-08-22 NOTE — Telephone Encounter (Signed)
Please call pt

## 2012-08-22 NOTE — Telephone Encounter (Signed)
Spoke with patient and she will get report from Dr Luciana Axe and fax them here.  Noted by Dr Tawanna Cooler

## 2013-01-02 ENCOUNTER — Other Ambulatory Visit: Payer: Self-pay | Admitting: Family Medicine

## 2013-03-09 ENCOUNTER — Other Ambulatory Visit: Payer: Commercial Managed Care - PPO

## 2013-03-16 ENCOUNTER — Encounter: Payer: Commercial Managed Care - PPO | Admitting: Family Medicine

## 2015-03-07 ENCOUNTER — Other Ambulatory Visit: Payer: Self-pay | Admitting: Family Medicine

## 2015-03-08 ENCOUNTER — Other Ambulatory Visit: Payer: Self-pay | Admitting: Obstetrics and Gynecology

## 2015-03-08 DIAGNOSIS — R928 Other abnormal and inconclusive findings on diagnostic imaging of breast: Secondary | ICD-10-CM

## 2015-03-12 ENCOUNTER — Ambulatory Visit
Admission: RE | Admit: 2015-03-12 | Discharge: 2015-03-12 | Disposition: A | Discharge status: Home / Self Care | Payer: Managed Care, Other (non HMO) | Source: Ambulatory Visit | Attending: Obstetrics and Gynecology | Admitting: Obstetrics and Gynecology

## 2015-03-12 DIAGNOSIS — R928 Other abnormal and inconclusive findings on diagnostic imaging of breast: Secondary | ICD-10-CM

## 2015-09-17 ENCOUNTER — Other Ambulatory Visit: Payer: Self-pay | Admitting: Obstetrics and Gynecology

## 2015-09-17 DIAGNOSIS — R921 Mammographic calcification found on diagnostic imaging of breast: Secondary | ICD-10-CM

## 2015-09-20 ENCOUNTER — Ambulatory Visit
Admission: RE | Admit: 2015-09-20 | Discharge: 2015-09-20 | Disposition: A | Discharge status: Home / Self Care | Payer: Managed Care, Other (non HMO) | Source: Ambulatory Visit | Attending: Obstetrics and Gynecology | Admitting: Obstetrics and Gynecology

## 2015-09-20 DIAGNOSIS — R921 Mammographic calcification found on diagnostic imaging of breast: Secondary | ICD-10-CM

## 2016-03-17 ENCOUNTER — Other Ambulatory Visit: Payer: Self-pay | Admitting: Obstetrics and Gynecology

## 2016-03-17 ENCOUNTER — Other Ambulatory Visit: Payer: Self-pay | Admitting: Family Medicine

## 2016-03-17 DIAGNOSIS — R921 Mammographic calcification found on diagnostic imaging of breast: Secondary | ICD-10-CM

## 2016-03-27 ENCOUNTER — Ambulatory Visit
Admission: RE | Admit: 2016-03-27 | Discharge: 2016-03-27 | Disposition: A | Discharge status: Home / Self Care | Payer: Managed Care, Other (non HMO) | Source: Ambulatory Visit | Attending: Family Medicine | Admitting: Family Medicine

## 2016-03-27 ENCOUNTER — Ambulatory Visit
Admission: RE | Admit: 2016-03-27 | Discharge: 2016-03-27 | Disposition: A | Discharge status: Home / Self Care | Payer: Managed Care, Other (non HMO) | Source: Ambulatory Visit | Attending: Obstetrics and Gynecology | Admitting: Obstetrics and Gynecology

## 2016-03-27 DIAGNOSIS — R921 Mammographic calcification found on diagnostic imaging of breast: Secondary | ICD-10-CM

## 2017-02-17 ENCOUNTER — Other Ambulatory Visit: Payer: Self-pay | Admitting: Family Medicine

## 2017-02-17 DIAGNOSIS — Z1231 Encounter for screening mammogram for malignant neoplasm of breast: Secondary | ICD-10-CM

## 2017-04-02 ENCOUNTER — Ambulatory Visit: Payer: Managed Care, Other (non HMO)

## 2017-04-16 ENCOUNTER — Encounter: Payer: Self-pay | Admitting: Radiology

## 2017-04-16 ENCOUNTER — Ambulatory Visit
Admission: RE | Admit: 2017-04-16 | Discharge: 2017-04-16 | Disposition: A | Discharge status: Home / Self Care | Payer: Managed Care, Other (non HMO) | Source: Ambulatory Visit | Attending: Family Medicine | Admitting: Family Medicine

## 2017-04-16 DIAGNOSIS — Z1231 Encounter for screening mammogram for malignant neoplasm of breast: Secondary | ICD-10-CM

## 2018-05-05 ENCOUNTER — Other Ambulatory Visit: Payer: Self-pay | Admitting: Family Medicine

## 2018-05-05 DIAGNOSIS — Z1231 Encounter for screening mammogram for malignant neoplasm of breast: Secondary | ICD-10-CM

## 2018-05-06 ENCOUNTER — Ambulatory Visit
Admission: RE | Admit: 2018-05-06 | Discharge: 2018-05-06 | Disposition: A | Discharge status: Home / Self Care | Payer: Managed Care, Other (non HMO) | Source: Ambulatory Visit | Attending: Family Medicine | Admitting: Family Medicine

## 2018-05-06 DIAGNOSIS — Z1231 Encounter for screening mammogram for malignant neoplasm of breast: Secondary | ICD-10-CM

## 2019-03-31 ENCOUNTER — Other Ambulatory Visit: Payer: Self-pay | Admitting: Family Medicine

## 2019-03-31 DIAGNOSIS — Z1231 Encounter for screening mammogram for malignant neoplasm of breast: Secondary | ICD-10-CM

## 2019-05-11 ENCOUNTER — Ambulatory Visit
Admission: RE | Admit: 2019-05-11 | Discharge: 2019-05-11 | Disposition: A | Discharge status: Home / Self Care | Payer: Managed Care, Other (non HMO) | Source: Ambulatory Visit | Attending: Family Medicine | Admitting: Family Medicine

## 2019-05-11 ENCOUNTER — Other Ambulatory Visit: Payer: Self-pay

## 2019-05-11 DIAGNOSIS — Z1231 Encounter for screening mammogram for malignant neoplasm of breast: Secondary | ICD-10-CM

## 2020-05-21 ENCOUNTER — Other Ambulatory Visit: Payer: Self-pay | Admitting: Family Medicine

## 2020-05-21 DIAGNOSIS — Z1231 Encounter for screening mammogram for malignant neoplasm of breast: Secondary | ICD-10-CM

## 2020-07-12 ENCOUNTER — Ambulatory Visit: Payer: Managed Care, Other (non HMO)

## 2020-07-15 ENCOUNTER — Ambulatory Visit
Admission: RE | Admit: 2020-07-15 | Discharge: 2020-07-15 | Disposition: A | Discharge status: Home / Self Care | Payer: Medicare HMO | Source: Ambulatory Visit | Attending: Family Medicine | Admitting: Family Medicine

## 2020-07-15 ENCOUNTER — Other Ambulatory Visit: Payer: Self-pay

## 2020-07-15 DIAGNOSIS — Z1231 Encounter for screening mammogram for malignant neoplasm of breast: Secondary | ICD-10-CM

## 2020-09-13 ENCOUNTER — Telehealth: Payer: Self-pay

## 2020-09-13 NOTE — Telephone Encounter (Signed)
Referral notes sent from [facility], Phone #: ,[phone] Fax #:  [phone]  Notes sent to scheduling

## 2020-11-01 ENCOUNTER — Ambulatory Visit: Payer: Medicare HMO | Admitting: Cardiology

## 2020-11-28 ENCOUNTER — Ambulatory Visit: Payer: Medicare HMO | Admitting: Cardiology

## 2020-12-10 ENCOUNTER — Telehealth: Payer: Self-pay

## 2020-12-10 NOTE — Telephone Encounter (Signed)
NOTES SCANNED TO REFERRAL 

## 2020-12-23 NOTE — Progress Notes (Signed)
Cardiology Office Note:    Date:  12/24/2020   ID:  Danielle Harvey, DOB Jun 19, 1950, MRN [id number]  PCP:  Chesley Noon, MD  Cardiologist:  Shirlee More, MD   Referring MD: Chesley Noon, MD  ASSESSMENT:    1. Palpitations   2. Primary hypertension   3. Mixed hyperlipidemia    PLAN:    In order of problems listed above:  She has episodes of high rate detected by a smart watch in the setting of over-the-counter decongestant otherwise asymptomatic and has had no recurrence.  We reviewed options for evaluation we could utilize an extended event monitor but she has had no episode in months and I think she is best to screen her self with her apple watch to capture episodes and she can transmission to me in my chart.  I gave her a list of over-the-counter proarrhythmic drugs to avoid Stable I encouraged her to trend her blood pressure at home and continue her regular exercise program and sodium restriction Stable continue her statin  Next appointment I will plan to see her back in the office as needed and she can transmit monitor strips to me if captured   Medication Adjustments/Labs and Tests Ordered: Current medicines are reviewed at length with the patient today.  Concerns regarding medicines are outlined above.  Orders Placed This Encounter  Procedures   EKG 12-Lead   No orders of the defined types were placed in this encounter.    Chief Complaint  Patient presents with   Palpitations  My heart was rapid on my smart watch with an alarm  History of Present Illness:    Danielle Harvey is a 70 y.o. female who is being seen today for the evaluation of palpitation with rapid heart rate at the request of Chesley Noon, MD.  Chart review shows no cardiology evaluation or diagnostic testing within epic. She takes a combination proximal and distal diuretic Maxide .  Several months ago she had episodes of her heart pain rapid. She had a smart watch For heart rate  exceeding 120 at rest and was actually 148 bpm.  The longest episode lasted 30 minutes.  She was otherwise unaware she had no palpitation lightheadedness did not feel ill.  She was seen by her PCP and tells me her EKG was normal at that time.  She is taking over-the-counter combination decongestant she had to sign for behind the counter in the pharmacy she was concerned it may have been the cause stopped taking has had no recurrence.  She now has the newest version of the apple smart watch that detects atrial fibrillation and records EKGs and she has had no recurrence. She has a history of hypertension on Dyazide and hyperlipidemia on a statin. She exercise on a regular basis She has no known history of heart disease congenital or rheumatic. She tells me she has an established diagnosis of hypertension takes a combination proximal and distal diuretic but does not trend her blood pressures at home  Recent labs performed [facility] 12/13/2020: Sodium 138 potassium 4.3 GFR 65 cc glucose 85 Cholesterol 242 LDL 115 triglycerides 172 HDL 98 TSH normal 2.47 Hemoglobin normal 12.9 Past Medical History:  Diagnosis Date   ALLERGIC RHINITIS 08/06/2008   Closed fracture of one rib 07/05/2007   ELEVATED BLOOD PRESSURE WITHOUT DIAGNOSIS OF HYPERTENSION 03/02/2007   HYPERLIPIDEMIA 03/02/2007    Past Surgical History:  Procedure Laterality Date   ANKLE SURGERY Right    Due to fracture  cataract surgery      Current Medications: Current Meds  Medication Sig   Ascorbic Acid 500 MG CHEW Chew 500 mg by mouth daily.   aspirin 81 MG tablet Take 81 mg by mouth daily.     atorvastatin (LIPITOR) 40 MG tablet Take 1 tablet (40 mg total) by mouth daily.   Calcium Carb-Cholecalciferol 500-400 MG-UNIT TABS Take 1 tablet by mouth daily.     cetirizine (ZYRTEC) 10 MG tablet Take 10 mg by mouth as needed.     gabapentin (NEURONTIN) 300 MG capsule Take 300 mg by mouth 2 (two) times daily as needed for pain.    Multiple Vitamin (MULTIVITAMIN) tablet Take 1 tablet by mouth daily.     omeprazole (PRILOSEC) 40 MG capsule Take 40 mg by mouth daily.   triamterene-hydrochlorothiazide (MAXZIDE) 75-50 MG per tablet TAKE 1 TABLET BY MOUTH ONCE A DAY   zolpidem (AMBIEN CR) 6.25 MG CR tablet Take 6.25 mg by mouth at bedtime as needed for sleep.     Allergies:   Patient has no known allergies.   Social History   Socioeconomic History   Marital status: Married    Spouse name: Not on file   Number of children: Not on file   Years of education: Not on file   Highest education level: Not on file  Occupational History   Not on file  Tobacco Use   Smoking status: Never   Smokeless tobacco: Never  Vaping Use   Vaping Use: Never used  Substance and Sexual Activity   Alcohol use: Yes    Comment: Daily   Drug use: No   Sexual activity: Not on file  Other Topics Concern   Not on file  Social History Narrative   Not on file   Social Determinants of Health   Financial Resource Strain: Not on file  Food Insecurity: Not on file  Transportation Needs: Not on file  Physical Activity: Not on file  Stress: Not on file  Social Connections: Not on file     Family History: The patient's family history includes Breast cancer in her mother and paternal uncle; Congestive Heart Failure in her mother; Heart Problems in her maternal grandfather, maternal grandmother, and mother. There is no history of Cancer, Hyperlipidemia, or Hypertension.  ROS:   ROS Please see the history of present illness.     All other systems reviewed and are negative.  EKGs/Labs/Other Studies Reviewed:    The following studies were reviewed today:   EKG:  EKG is  ordered today.  The ekg ordered today is personally reviewed and demonstrates sinus rhythm and is normal  Recent Labs: No results found for requested labs within last 8760 hours.  Recent Lipid Panel    Component Value Date/Time   CHOL 232 (H) 01/14/2012 0846   TRIG  91.0 01/14/2012 0846   TRIG 99 12/22/2005 0911   HDL 80.10 01/14/2012 0846   CHOLHDL 3 01/14/2012 0846   VLDL 18.2 01/14/2012 0846   LDLCALC 98 07/26/2008 0818   LDLDIRECT 133.1 01/14/2012 0846    Physical Exam:    VS:  BP (!) 142/70 (BP Location: Right Arm, Patient Position: Sitting, Cuff Size: Normal)   Pulse 85   Ht 5' 4.75" (1.645 m)   Wt 171 lb (77.6 kg)   SpO2 99%   BMI 28.68 kg/m     Wt Readings from Last 3 Encounters:  12/24/20 171 lb (77.6 kg)  01/21/12 142 lb (64.4 kg)  04/13/11 154 lb (  69.9 kg)     GEN: She appears her age well nourished, well developed in no acute distress HEENT: Normal NECK: No JVD; No carotid bruits LYMPHATICS: No lymphadenopathy CARDIAC: RRR, no murmurs, rubs, gallops RESPIRATORY:  Clear to auscultation without rales, wheezing or rhonchi  ABDOMEN: Soft, non-tender, non-distended MUSCULOSKELETAL:  No edema; No deformity  SKIN: Warm and dry NEUROLOGIC:  Alert and oriented x 3 PSYCHIATRIC:  Normal affect     Signed, Shirlee More, MD  12/24/2020 4:34 PM    [facility]

## 2020-12-24 ENCOUNTER — Ambulatory Visit: Payer: Medicare HMO | Admitting: Cardiology

## 2020-12-24 ENCOUNTER — Other Ambulatory Visit: Payer: Self-pay

## 2020-12-24 ENCOUNTER — Encounter: Payer: Self-pay | Admitting: Cardiology

## 2020-12-24 VITALS — BP 142/70 | HR 85 | Ht 64.75 in | Wt 171.0 lb

## 2020-12-24 DIAGNOSIS — R002 Palpitations: Secondary | ICD-10-CM

## 2020-12-24 DIAGNOSIS — E782 Mixed hyperlipidemia: Secondary | ICD-10-CM | POA: Diagnosis not present

## 2020-12-24 DIAGNOSIS — I10 Essential (primary) hypertension: Secondary | ICD-10-CM | POA: Diagnosis not present

## 2020-12-24 NOTE — Patient Instructions (Addendum)
Medication Instructions:  Your physician recommends that you continue on your current medications as directed. Please refer to the Current Medication list given to you today.  *If you need a refill on your cardiac medications before your next appointment, please call your pharmacy*   Lab Work: None If you have labs (blood work) drawn today and your tests are completely normal, you will receive your results only by: [place] (if you have MyChart) OR A paper copy in the mail If you have any lab test that is abnormal or we need to change your treatment, we will call you to review the results.   Testing/Procedures: None   Follow-Up: At [facility], you and your health needs are our priority.  As part of our continuing mission to provide you with exceptional heart care, we have created designated Provider Care Teams.  These Care Teams include your primary Cardiologist (physician) and Advanced Practice Providers (APPs -  Physician Assistants and Nurse Practitioners) who all work together to provide you with the care you need, when you need it.  We recommend signing up for the patient portal called "MyChart".  Sign up information is provided on this After Visit Summary.  MyChart is used to connect with patients for Virtual Visits (Telemedicine).  Patients are able to view lab/test results, encounter notes, upcoming appointments, etc.  Non-urgent messages can be sent to your provider as well.   To learn more about what you can do with MyChart, go to [email or web address].    Your next appointment:   As needed  The format for your next appointment:   In Person  Provider:   Shirlee More, MD   Other Instructions 1. Avoid all over-the-counter antihistamines except Claritin/Loratadine and Zyrtec/Cetrizine. 2. Avoid all combination including cold sinus allergies flu decongestant and sleep medications 3. You can use Robitussin DM Mucinex and Mucinex DM for cough. 4. can use Tylenol  aspirin ibuprofen and naproxen but no combinations such as sleep or sinus.   [facility]  Tips to measure your blood pressure correctly  To determine whether you have hypertension, a medical professional will take a blood pressure reading. How you prepare for the test, the position of your arm, and other factors can change a blood pressure reading by 10% or more. That could be enough to hide high blood pressure, start you on a drug you don't really need, or lead your doctor to incorrectly adjust your medications. National and international guidelines offer specific instructions for measuring blood pressure. If a doctor, nurse, or medical assistant isn't doing it right, don't hesitate to ask him or her to get with the guidelines. Here's what you can do to ensure a correct reading:  Don't drink a caffeinated beverage or smoke during the 30 minutes before the test.  Sit quietly for five minutes before the test begins.  During the measurement, sit in a chair with your feet on the floor and your arm supported so your elbow is at about heart level.  The inflatable part of the cuff should completely cover at least 80% of your upper arm, and the cuff should be placed on bare skin, not over a shirt.  Don't talk during the measurement.  Have your blood pressure measured twice, with a brief break in between. If the readings are different by 5 points or more, have it done a third time. There are times to break these rules. If you sometimes feel lightheaded when getting out of bed in the morning or when you  stand after sitting, you should have your blood pressure checked while seated and then while standing to see if it falls from one position to the next. Because blood pressure varies throughout the day, your doctor will rarely diagnose hypertension on the basis of a single reading. Instead, he or she will want to confirm the measurements on at least two occasions, usually within a few weeks of one another. The  exception to this rule is if you have a blood pressure reading of 180/110 mm Hg or higher. A result this high usually calls for prompt treatment. It's also a good idea to have your blood pressure measured in both arms at least once, since the reading in one arm (usually the right) may be higher than that in the left. A 2014 study in The American Journal of Medicine of nearly 3,400 people found average arm- to-arm differences in systolic blood pressure of about 5 points. The higher number should be used to make treatment decisions. In 2017, new guidelines from the [place], the [employer] of Cardiology, and nine other health organizations lowered the diagnosis of high blood pressure to 130/80 mm Hg or higher for all adults. The guidelines also redefined the various blood pressure categories to now include normal, elevated, Stage 1 hypertension, Stage 2 hypertension, and hypertensive crisis (see "Blood pressure categories"). Blood pressure categories  Blood pressure category SYSTOLIC (upper number)  DIASTOLIC (lower number)  Normal Less than 120 mm Hg and Less than 80 mm Hg  Elevated 120-129 mm Hg and Less than 80 mm Hg  High blood pressure: Stage 1 hypertension 130-139 mm Hg or 80-89 mm Hg  High blood pressure: Stage 2 hypertension 140 mm Hg or higher or 90 mm Hg or higher  Hypertensive crisis (consult your doctor immediately) Higher than 180 mm Hg and/or Higher than 120 mm Hg  Source: American Heart Association and American Stroke Association. For more on getting your blood pressure under control, buy Controlling Your Blood Pressure, a Special Health Report from [employer]

## 2021-06-16 ENCOUNTER — Other Ambulatory Visit: Payer: Self-pay | Admitting: Family Medicine

## 2021-06-16 DIAGNOSIS — Z1231 Encounter for screening mammogram for malignant neoplasm of breast: Secondary | ICD-10-CM

## 2021-07-16 ENCOUNTER — Ambulatory Visit
Admission: RE | Admit: 2021-07-16 | Discharge: 2021-07-16 | Disposition: A | Discharge status: Home / Self Care | Payer: Medicare HMO | Source: Ambulatory Visit | Attending: Family Medicine | Admitting: Family Medicine

## 2021-07-16 DIAGNOSIS — Z1231 Encounter for screening mammogram for malignant neoplasm of breast: Secondary | ICD-10-CM

## 2021-07-17 ENCOUNTER — Other Ambulatory Visit: Payer: Self-pay | Admitting: Family Medicine

## 2021-07-17 DIAGNOSIS — R928 Other abnormal and inconclusive findings on diagnostic imaging of breast: Secondary | ICD-10-CM

## 2021-08-05 ENCOUNTER — Other Ambulatory Visit: Payer: Medicare HMO

## 2022-09-02 IMAGING — MG MM DIGITAL SCREENING BILAT W/ TOMO AND CAD
8 series · 8 of 24 positions shown · non-contrast
Comparison: Previous exam(s).

CLINICAL DATA: Screening.

EXAM:
DIGITAL SCREENING BILATERAL MAMMOGRAM WITH TOMOSYNTHESIS AND CAD
TECHNIQUE: Bilateral screening digital craniocaudal and mediolateral oblique
mammograms were obtained. Bilateral screening digital breast
tomosynthesis was performed. The images were evaluated with
computer-aided detection.

[R MLO synth-2D]
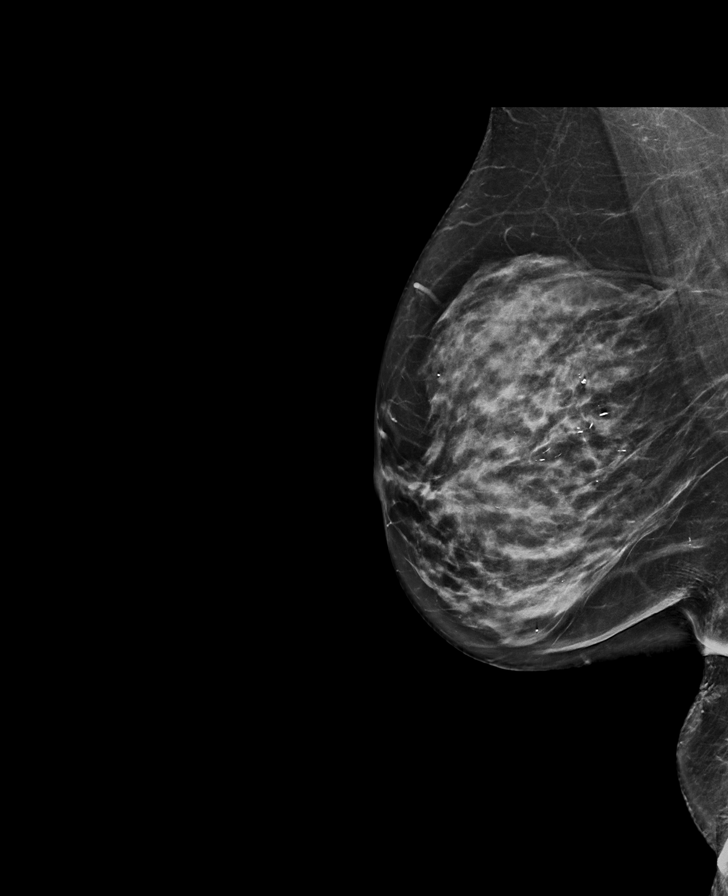

[L MLO synth-2D]
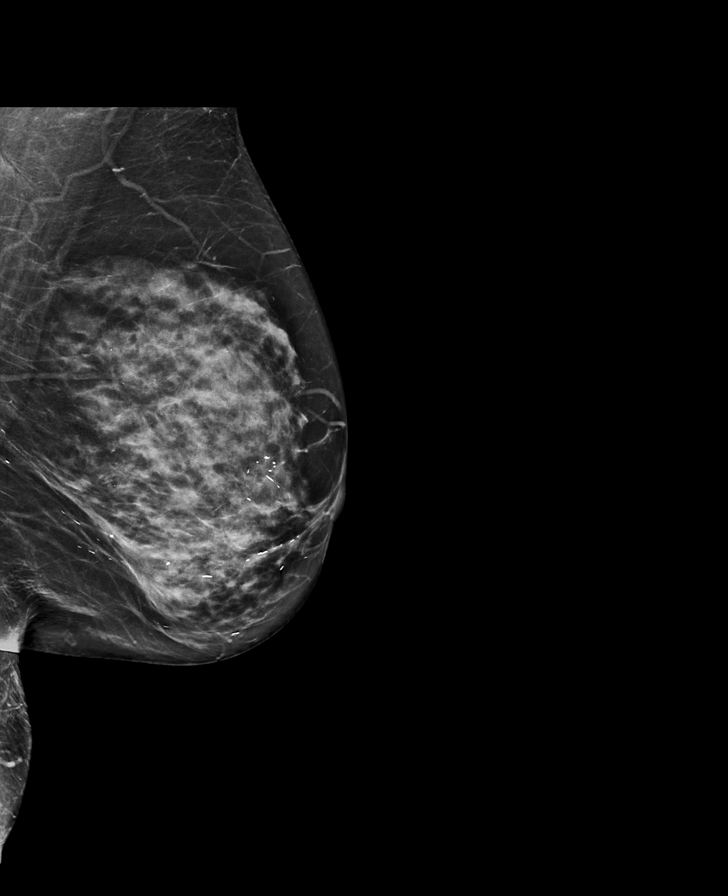

[R CC synth-2D]
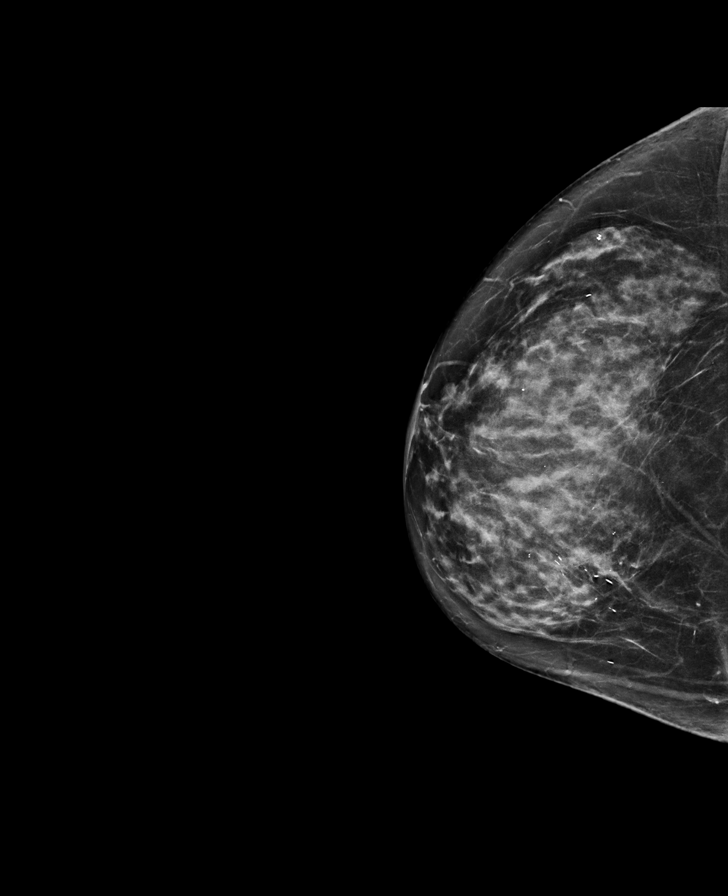

[L CC synth-2D]
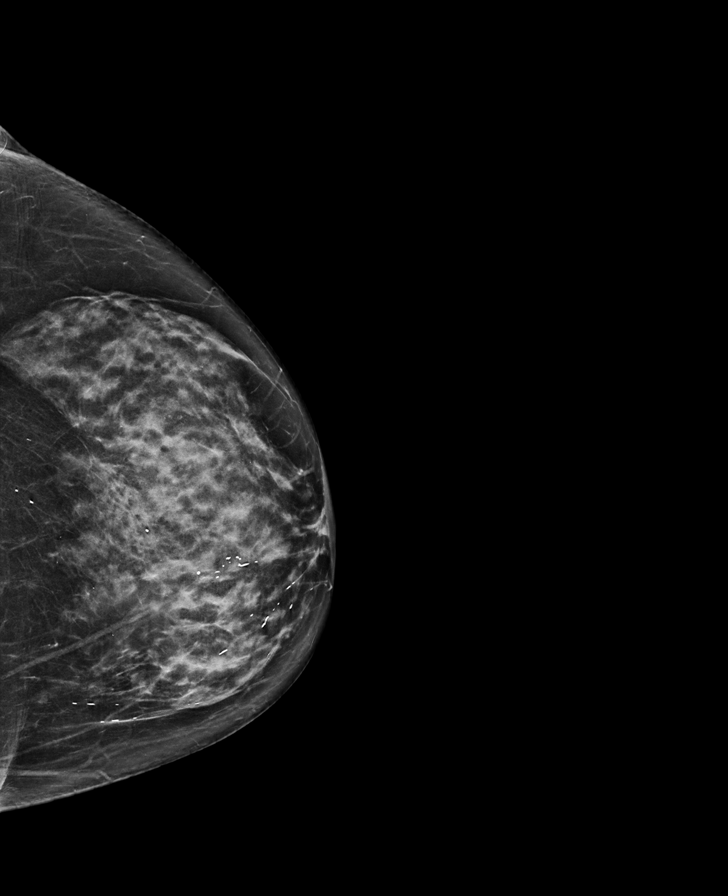

[R MLO tomo · tomo slice 36/71.0]
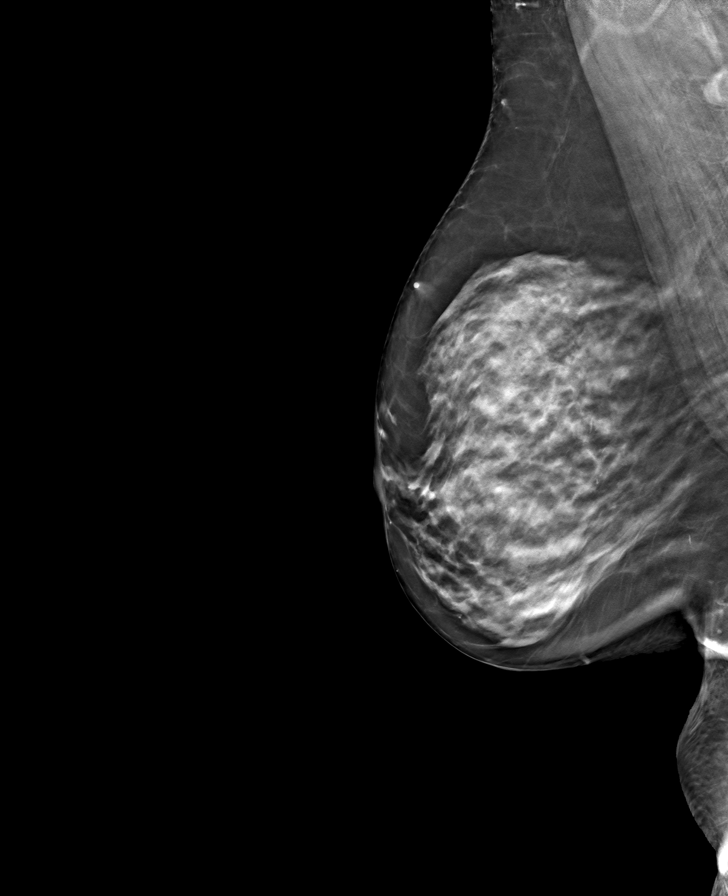

[L MLO tomo · tomo slice 35/70.0]
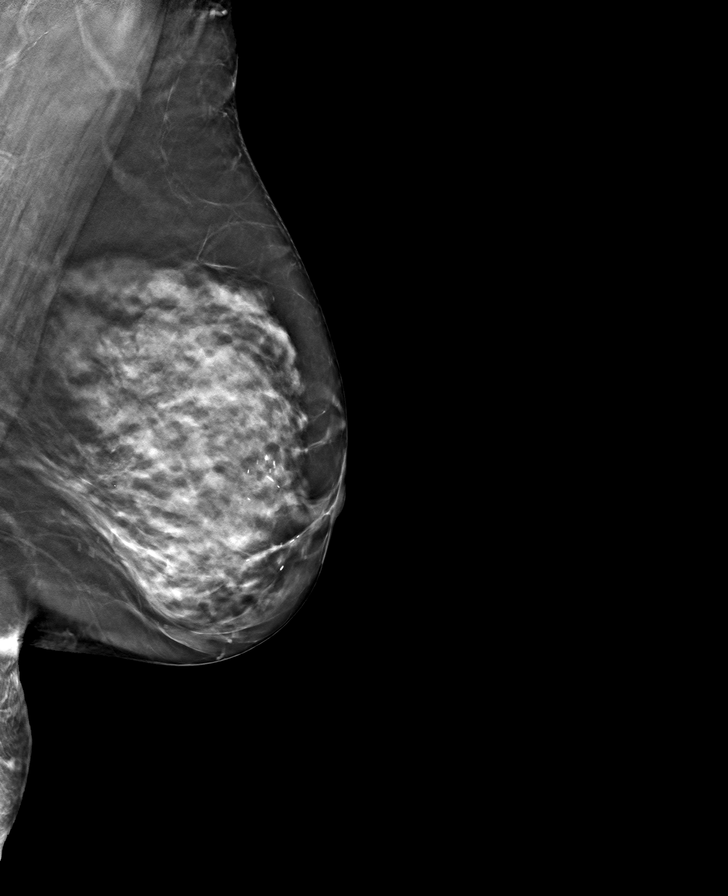

[R CC tomo · tomo slice 35/69.0]
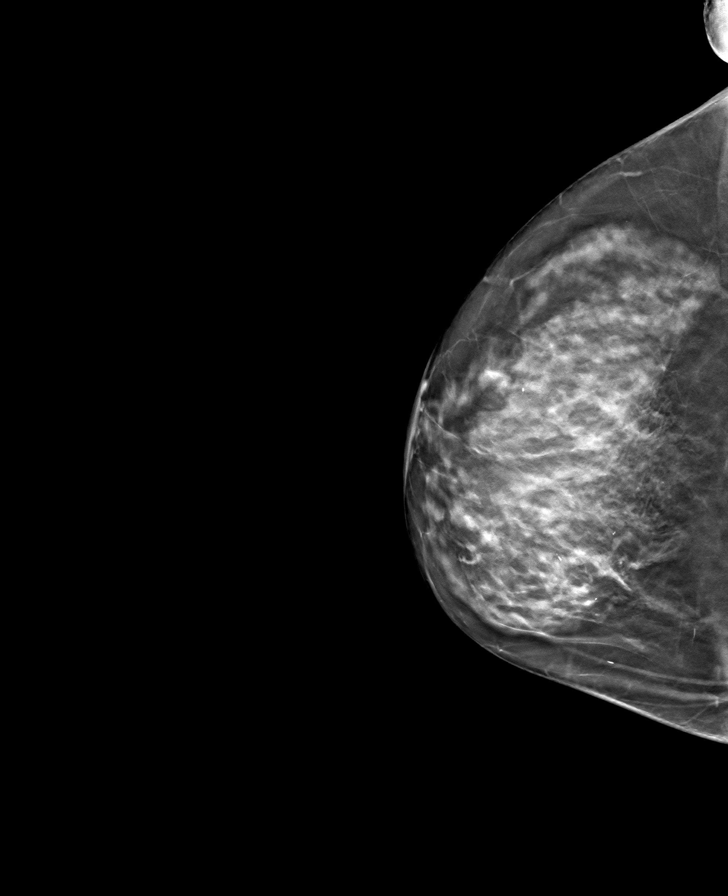

[L CC tomo · tomo slice 33/65.0]
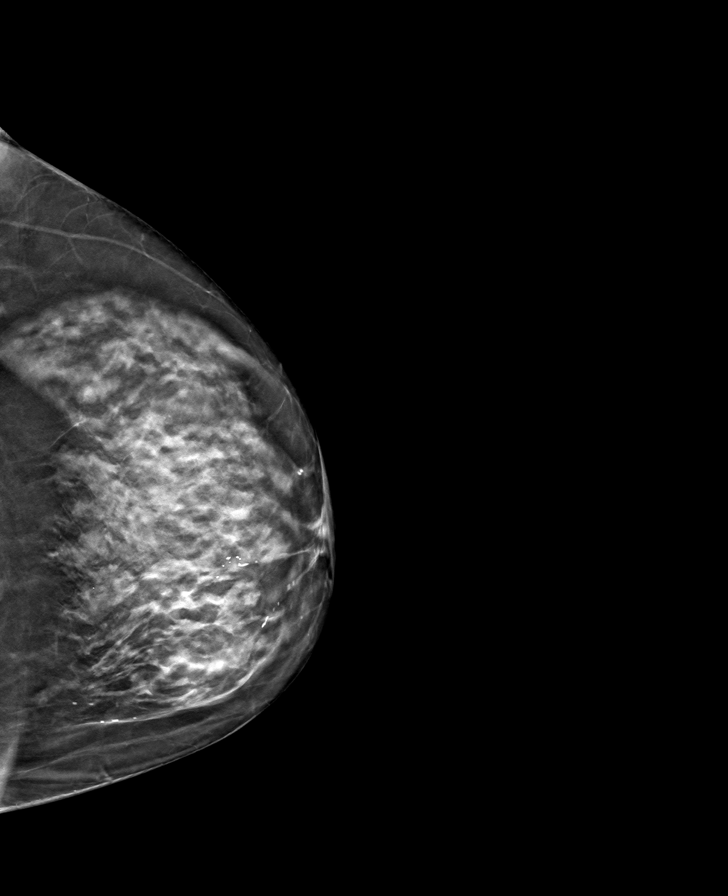

[8 of 24 positions shown; findings below may reference images not displayed]

ACR Breast Density Category d: The breast tissue is extremely dense,
which lowers the sensitivity of mammography.
FINDINGS: In the left breast, a possible asymmetry warrants further
evaluation. In the right breast, no findings suspicious for
malignancy.
IMPRESSION: Further evaluation is suggested for possible asymmetry in the left
breast.

RECOMMENDATION:
Diagnostic mammogram and possibly ultrasound of the left breast.
(Code:9Y-M-330)

The patient will be contacted regarding the findings, and additional
imaging will be scheduled.

BI-RADS CATEGORY  0: Incomplete. Need additional imaging evaluation
and/or prior mammograms for comparison.

## 2023-05-05 ENCOUNTER — Encounter: Payer: Self-pay | Admitting: Cardiology

## 2023-05-05 ENCOUNTER — Ambulatory Visit: Payer: Medicare HMO | Attending: Cardiology | Admitting: Cardiology

## 2023-05-05 VITALS — BP 139/81 | HR 86 | Resp 16 | Ht 64.0 in | Wt 170.0 lb

## 2023-05-05 DIAGNOSIS — E7800 Pure hypercholesterolemia, unspecified: Secondary | ICD-10-CM

## 2023-05-05 DIAGNOSIS — R931 Abnormal findings on diagnostic imaging of heart and coronary circulation: Secondary | ICD-10-CM | POA: Diagnosis not present

## 2023-05-05 DIAGNOSIS — I10 Essential (primary) hypertension: Secondary | ICD-10-CM | POA: Diagnosis not present

## 2023-05-05 MED ORDER — OLMESARTAN MEDOXOMIL-HCTZ 20-12.5 MG PO TABS: 1.0000 | ORAL_TABLET | ORAL | 0 refills | Status: AC

## 2023-05-05 NOTE — Progress Notes (Signed)
 Cardiology Office Note:  .   Date:  05/05/2023  ID:  Danielle Harvey, DOB 1950/12/03, MRN [id number] PCP: Beam, Danielle Salmon, MD  [facility] Providers Cardiologist:  Danielle Decamp, MD   History of Present Illness: .   Danielle Harvey is a 73 y.o. Caucasian female who is very active, recently retired as a Educational psychologist person, non-smoker, does drink 1 to 2 glasses of wine a day, referred to me for evaluation of abnormal coronary calcium score. She is asymptomatic.  Past medical history significant for hypertension, hypercholesterolemia.  There is no family history of sudden cardiac death or premature coronary disease.  Discussed the use of AI scribe software for clinical note transcription with the patient, who gave verbal consent to proceed.  History of Present Illness   The patient, with a history of hyperlipidemia and hypertension, was referred to cardiology due to a high coronary calcium score. The patient has been on two cholesterol medications for an extended period, and the addition of ezetimibe was made approximately six months ago. The patient reports no symptoms of angina or dyspnea and maintains an active lifestyle, exercising three to four times a week since retirement. The patient's family history is significant for high cholesterol, congestive heart failure, heart attacks, and strokes. The patient also has a history of hypertension, and a recent change in blood pressure medication resulted in significant swelling, prompting a return to the previous medication.      Labs   External Labs:  Care Everywhere labs 11/09/2022:  Total cholesterol 203, triglycerides 97, HDL 93, LDL 93.  CRP 2.47.  Serum glucose 96 mg, BUN 18, creatinine 0.96, EGFR 64 mL, potassium 4.7, LFTs normal.  TSH 03/30/2022: Normal at 1.480.  Review of Systems  Cardiovascular:  Negative for chest pain, dyspnea on exertion and leg swelling.   Physical Exam:   VS:  BP 139/81 (BP Location: Left Arm, Patient  Position: Sitting, Cuff Size: Large)   Pulse 86   Resp 16   Ht 5\' 4"  (1.626 m)   Wt 170 lb (77.1 kg)   SpO2 92%   BMI 29.18 kg/m    Wt Readings from Last 3 Encounters:  05/05/23 170 lb (77.1 kg)  12/24/20 171 lb (77.6 kg)  01/21/12 142 lb (64.4 kg)     Physical Exam Neck:     Vascular: No carotid bruit or JVD.  Cardiovascular:     Rate and Rhythm: Normal rate and regular rhythm.     Pulses: Intact distal pulses.     Heart sounds: Normal heart sounds. No murmur heard.    No gallop.  Pulmonary:     Effort: Pulmonary effort is normal.     Breath sounds: Normal breath sounds.  Abdominal:     General: Bowel sounds are normal.     Palpations: Abdomen is soft.  Musculoskeletal:     Right lower leg: No edema.     Left lower leg: No edema.    Studies Reviewed: Marland Kitchen    Coronary calcium score 11/17/2022 Total Agatston coronary calcium score 403. Hoffman database percentile 75-90 th. -- Left Main: 1.  -- Left Anterior Descending: 247.  -- Circumflex: 37.  -- Right Coronary: 118  -- Posterior Descending Artery: 0  Visualized ascending and descending aorta Normal. Extracardiac abnormalities: None.  EKG:    EKG Interpretation Date/Time:  Wednesday May 05 2023 08:15:52 EST Ventricular Rate:  92 PR Interval:  164 QRS Duration:  78 QT Interval:  336 QTC Calculation: 415 R  Axis:   0  Text Interpretation: EKG 05/05/2023: Normal sinus rhythm at rate of 92 bpm, normal EKG. Confirmed by Danielle Harvey [phone]) on 05/05/2023 8:24:21 AM    Medications and allergies    Allergies  Allergen Reactions   Amlodipine Swelling     Current Outpatient Medications:    Ascorbic Acid 500 MG CHEW, Chew 500 mg by mouth daily., Disp: , Rfl:    aspirin 81 MG tablet, Take 81 mg by mouth daily.  , Disp: , Rfl:    atorvastatin (LIPITOR) 40 MG tablet, Take 1 tablet (40 mg total) by mouth daily., Disp: 100 tablet, Rfl: 3   Calcium Carb-Cholecalciferol 500-400 MG-UNIT TABS, Take 1 tablet by mouth  daily.  , Disp: , Rfl:    cetirizine (ZYRTEC) 10 MG tablet, Take 10 mg by mouth as needed.  , Disp: , Rfl:    ezetimibe (ZETIA) 10 MG tablet, Take 10 mg by mouth daily., Disp: , Rfl:    Multiple Vitamin (MULTIVITAMIN) tablet, Take 1 tablet by mouth daily.  , Disp: , Rfl:    olmesartan-hydrochlorothiazide (BENICAR HCT) 20-12.5 MG tablet, Take 1 tablet by mouth every morning., Disp: 30 tablet, Rfl: 0   omeprazole (PRILOSEC) 40 MG capsule, Take 40 mg by mouth daily., Disp: , Rfl:    zolpidem (AMBIEN CR) 6.25 MG CR tablet, Take 6.25 mg by mouth at bedtime as needed for sleep., Disp: , Rfl:    ASSESSMENT AND PLAN: .      ICD-10-CM   1. Elevated coronary artery calcium score 11/17/2022: 403. Hoffman database percentile 75-90 th.  R93.1 EKG 12-Lead    2. Primary hypertension  I10 olmesartan-hydrochlorothiazide (BENICAR HCT) 20-12.5 MG tablet    3. Pure hypercholesterolemia  E78.00       1. Elevated coronary artery calcium score Patient with elevated coronary calcium score between 75 and 90th percentile however patient is not a aversion to statins prior to coronary calcium score being done hence the score may have been altered and increased by statin therapy.  Irrespective of this, appropriate face reduction would entice reducing her LDL to <70.  Over the last office visit Zetia was added to her present medical regimen, she has a lipid profile testing pending next month soon with her PCP which is appropriate.  Patient is completely asymptomatic and exercise on a regular basis without any chest pain or dyspnea, hence in the absence of fever and atypical symptoms of angina, would not recommend any physiologic testing at this time.  She will continue to exercise without any limitations.  2. Primary hypertension In view of underlying coronary atherosclerosis as evidenced by coronary calcium score >400, would recommend ACE inhibitor therapy or ARB therapy for hypertension management, I have  discontinued Maxide and added olmesartan HCT 20/12.5 mg in the morning, if blood pressure is not at goal that is <130/70 mmHg, she can double up on the dose and follow-up with her PCP for prescription refills.  3. Pure hypercholesterolemia As dictated above goal LDL is <70.  Hopefully with the addition of Zetia, LDL will be <70.  Again strict diet with reducing salt intake, reducing high fat intake, reducing red meat intake and at least incorporating 2 vegetarian meals a week would certainly help with further reduction in LDL and improving the LDL size and HDL size as well.  I will see her back on a as needed basis.  All questions answered.       Signed,  Danielle Decamp, MD, [facility] 05/05/2023, 8:46  AM [facility] [address] [place], [place] [address] Phone: [phone]. Fax:  [phone]

## 2023-05-05 NOTE — Patient Instructions (Addendum)
 Medication Instructions:  Stop Maxzide Start Olmesartan hydrochlorothiazide 20/12.5 mg by mouth daily   *If you need a refill on your cardiac medications before your next appointment, please call your pharmacy*   Lab Work: none If you have labs (blood work) drawn today and your tests are completely normal, you will receive your results only by: MyChart Message (if you have MyChart) OR A paper copy in the mail If you have any lab test that is abnormal or we need to change your treatment, we will call you to review the results.   Testing/Procedures: none   Follow-Up: At [facility], you and your health needs are our priority.  As part of our continuing mission to provide you with exceptional heart care, we have created designated Provider Care Teams.  These Care Teams include your primary Cardiologist (physician) and Advanced Practice Providers (APPs -  Physician Assistants and Nurse Practitioners) who all work together to provide you with the care you need, when you need it.  We recommend signing up for the patient portal called "MyChart".  Sign up information is provided on this After Visit Summary.  MyChart is used to connect with patients for Virtual Visits (Telemedicine).  Patients are able to view lab/test results, encounter notes, upcoming appointments, etc.  Non-urgent messages can be sent to your provider as well.   To learn more about what you can do with MyChart, go to [email or web address].    Your next appointment:   As needed  Provider:   Yates Decamp, MD     Other Instructions
